# Patient Record
Sex: Female | Born: 1970 | Race: White | Hispanic: No | Marital: Married | Smoking: Never smoker
Health system: Southern US, Community
[De-identification: ages and names within clinical notes are randomized; demographics above are authoritative.]

## PROBLEM LIST (undated history)

## (undated) DIAGNOSIS — N840 Polyp of corpus uteri: Secondary | ICD-10-CM

## (undated) DIAGNOSIS — E063 Autoimmune thyroiditis: Secondary | ICD-10-CM

## (undated) DIAGNOSIS — R32 Unspecified urinary incontinence: Secondary | ICD-10-CM

## (undated) DIAGNOSIS — N946 Dysmenorrhea, unspecified: Secondary | ICD-10-CM

## (undated) DIAGNOSIS — K219 Gastro-esophageal reflux disease without esophagitis: Secondary | ICD-10-CM

## (undated) DIAGNOSIS — D649 Anemia, unspecified: Secondary | ICD-10-CM

## (undated) DIAGNOSIS — R87619 Unspecified abnormal cytological findings in specimens from cervix uteri: Secondary | ICD-10-CM

## (undated) DIAGNOSIS — K760 Fatty (change of) liver, not elsewhere classified: Secondary | ICD-10-CM

## (undated) DIAGNOSIS — I10 Essential (primary) hypertension: Secondary | ICD-10-CM

## (undated) DIAGNOSIS — F419 Anxiety disorder, unspecified: Secondary | ICD-10-CM

## (undated) HISTORY — PX: CHOLECYSTECTOMY: SHX55

## (undated) HISTORY — DX: Anemia, unspecified: D64.9

## (undated) HISTORY — DX: Essential (primary) hypertension: I10

## (undated) HISTORY — DX: Unspecified urinary incontinence: R32

## (undated) HISTORY — DX: Dysmenorrhea, unspecified: N94.6

## (undated) HISTORY — DX: Anxiety disorder, unspecified: F41.9

## (undated) HISTORY — DX: Unspecified abnormal cytological findings in specimens from cervix uteri: R87.619

## (undated) HISTORY — DX: Gastro-esophageal reflux disease without esophagitis: K21.9

## (undated) HISTORY — DX: Fatty (change of) liver, not elsewhere classified: K76.0

## (undated) HISTORY — DX: Autoimmune thyroiditis: E06.3

## (undated) HISTORY — DX: Polyp of corpus uteri: N84.0

---

## 2000-05-27 ENCOUNTER — Other Ambulatory Visit: Admission: RE | Admit: 2000-05-27 | Discharge: 2000-05-27 | Payer: Self-pay | Admitting: Obstetrics and Gynecology

## 2003-04-15 ENCOUNTER — Encounter: Payer: Self-pay | Admitting: Family Medicine

## 2003-04-15 ENCOUNTER — Ambulatory Visit (HOSPITAL_COMMUNITY): Admission: RE | Admit: 2003-04-15 | Discharge: 2003-04-15 | Payer: Self-pay | Admitting: Family Medicine

## 2003-05-13 ENCOUNTER — Encounter: Payer: Self-pay | Admitting: Family Medicine

## 2003-05-13 ENCOUNTER — Ambulatory Visit (HOSPITAL_COMMUNITY): Admission: RE | Admit: 2003-05-13 | Discharge: 2003-05-13 | Payer: Self-pay | Admitting: Family Medicine

## 2003-08-24 ENCOUNTER — Observation Stay (HOSPITAL_COMMUNITY): Admission: RE | Admit: 2003-08-24 | Discharge: 2003-08-25 | Payer: Self-pay | Admitting: General Surgery

## 2003-12-27 ENCOUNTER — Other Ambulatory Visit: Admission: RE | Admit: 2003-12-27 | Discharge: 2003-12-27 | Payer: Self-pay | Admitting: Obstetrics & Gynecology

## 2003-12-27 ENCOUNTER — Encounter: Admission: RE | Admit: 2003-12-27 | Discharge: 2003-12-27 | Payer: Self-pay | Admitting: Obstetrics and Gynecology

## 2004-04-24 ENCOUNTER — Ambulatory Visit: Payer: Self-pay | Admitting: Obstetrics & Gynecology

## 2004-04-26 ENCOUNTER — Ambulatory Visit: Payer: Self-pay | Admitting: *Deleted

## 2004-05-10 ENCOUNTER — Ambulatory Visit: Payer: Self-pay | Admitting: Family Medicine

## 2004-05-14 ENCOUNTER — Ambulatory Visit: Payer: Self-pay | Admitting: Family Medicine

## 2004-05-21 ENCOUNTER — Ambulatory Visit: Payer: Self-pay | Admitting: Family Medicine

## 2004-08-06 ENCOUNTER — Ambulatory Visit: Payer: Self-pay | Admitting: Family Medicine

## 2004-08-17 ENCOUNTER — Ambulatory Visit: Payer: Self-pay | Admitting: Family Medicine

## 2004-09-20 ENCOUNTER — Ambulatory Visit: Payer: Self-pay | Admitting: Family Medicine

## 2004-10-29 ENCOUNTER — Ambulatory Visit: Payer: Self-pay | Admitting: Family Medicine

## 2004-11-09 ENCOUNTER — Ambulatory Visit: Payer: Self-pay | Admitting: Pulmonary Disease

## 2004-11-21 ENCOUNTER — Encounter: Admission: RE | Admit: 2004-11-21 | Discharge: 2005-02-19 | Payer: Self-pay | Admitting: Pulmonary Disease

## 2004-11-23 ENCOUNTER — Other Ambulatory Visit: Admission: RE | Admit: 2004-11-23 | Discharge: 2004-11-23 | Payer: Self-pay | Admitting: Obstetrics and Gynecology

## 2004-11-30 ENCOUNTER — Encounter: Admission: RE | Admit: 2004-11-30 | Discharge: 2004-11-30 | Payer: Self-pay | Admitting: Obstetrics and Gynecology

## 2005-04-25 ENCOUNTER — Ambulatory Visit: Payer: Self-pay | Admitting: Pulmonary Disease

## 2005-07-03 ENCOUNTER — Other Ambulatory Visit: Admission: RE | Admit: 2005-07-03 | Discharge: 2005-07-03 | Payer: Self-pay | Admitting: Obstetrics and Gynecology

## 2005-08-19 DIAGNOSIS — R87619 Unspecified abnormal cytological findings in specimens from cervix uteri: Secondary | ICD-10-CM

## 2005-08-19 HISTORY — DX: Unspecified abnormal cytological findings in specimens from cervix uteri: R87.619

## 2005-08-23 ENCOUNTER — Other Ambulatory Visit: Admission: RE | Admit: 2005-08-23 | Discharge: 2005-08-23 | Payer: Self-pay | Admitting: Obstetrics and Gynecology

## 2005-11-18 ENCOUNTER — Ambulatory Visit: Payer: Self-pay | Admitting: Pulmonary Disease

## 2005-12-24 ENCOUNTER — Other Ambulatory Visit: Admission: RE | Admit: 2005-12-24 | Discharge: 2005-12-24 | Payer: Self-pay | Admitting: Obstetrics & Gynecology

## 2005-12-31 ENCOUNTER — Other Ambulatory Visit: Admission: RE | Admit: 2005-12-31 | Discharge: 2005-12-31 | Payer: Self-pay | Admitting: Obstetrics & Gynecology

## 2006-02-13 ENCOUNTER — Ambulatory Visit: Payer: Self-pay | Admitting: Pulmonary Disease

## 2006-05-02 ENCOUNTER — Other Ambulatory Visit: Admission: RE | Admit: 2006-05-02 | Discharge: 2006-05-02 | Payer: Self-pay | Admitting: Obstetrics & Gynecology

## 2006-06-13 ENCOUNTER — Ambulatory Visit: Payer: Self-pay | Admitting: Internal Medicine

## 2006-09-01 ENCOUNTER — Other Ambulatory Visit: Admission: RE | Admit: 2006-09-01 | Discharge: 2006-09-01 | Payer: Self-pay | Admitting: Obstetrics & Gynecology

## 2006-11-19 ENCOUNTER — Ambulatory Visit: Payer: Self-pay | Admitting: Pulmonary Disease

## 2006-11-19 LAB — CONVERTED CEMR LAB
ALT: 18 units/L (ref 0–40)
AST: 22 units/L (ref 0–37)
Albumin: 3.3 g/dL — ABNORMAL LOW (ref 3.5–5.2)
Alkaline Phosphatase: 56 units/L (ref 39–117)
BUN: 15 mg/dL (ref 6–23)
Basophils Absolute: 0 10*3/uL (ref 0.0–0.1)
Basophils Relative: 0.1 % (ref 0.0–1.0)
Bilirubin Urine: NEGATIVE
Bilirubin, Direct: 0.1 mg/dL (ref 0.0–0.3)
CO2: 25 meq/L (ref 19–32)
Calcium: 9.2 mg/dL (ref 8.4–10.5)
Chloride: 109 meq/L (ref 96–112)
Cholesterol: 207 mg/dL (ref 0–200)
Creatinine, Ser: 0.8 mg/dL (ref 0.4–1.2)
Direct LDL: 120.8 mg/dL
Eosinophils Absolute: 0.1 10*3/uL (ref 0.0–0.6)
Eosinophils Relative: 0.5 % (ref 0.0–5.0)
GFR calc Af Amer: 105 mL/min
GFR calc non Af Amer: 87 mL/min
Glucose, Bld: 95 mg/dL (ref 70–99)
HCT: 40.6 % (ref 36.0–46.0)
HDL: 53.1 mg/dL (ref >39.0)
Hemoglobin, Urine: NEGATIVE
Hemoglobin: 14.2 g/dL (ref 12.0–15.0)
Ketones, ur: NEGATIVE mg/dL
Leukocytes, UA: NEGATIVE
Lymphocytes Relative: 31.8 % (ref 12.0–46.0)
MCHC: 34.9 g/dL (ref 30.0–36.0)
MCV: 88.2 fL (ref 78.0–100.0)
Monocytes Absolute: 0.5 10*3/uL (ref 0.2–0.7)
Monocytes Relative: 4.1 % (ref 3.0–11.0)
Neutro Abs: 6.9 10*3/uL (ref 1.4–7.7)
Neutrophils Relative %: 63.5 % (ref 43.0–77.0)
Nitrite: NEGATIVE
Platelets: 268 10*3/uL (ref 150–400)
Potassium: 4.1 meq/L (ref 3.5–5.1)
RBC: 4.6 M/uL (ref 3.87–5.11)
RDW: 11.7 % (ref 11.5–14.6)
Sodium: 142 meq/L (ref 135–145)
Specific Gravity, Urine: 1.025 (ref 1.000–1.03)
TSH: 1.43 microintl units/mL (ref 0.35–5.50)
Total Bilirubin: 0.6 mg/dL (ref 0.3–1.2)
Total CHOL/HDL Ratio: 3.9
Total Protein, Urine: NEGATIVE mg/dL
Total Protein: 6.8 g/dL (ref 6.0–8.3)
Triglycerides: 383 mg/dL (ref 0–149)
Urine Glucose: NEGATIVE mg/dL
Urobilinogen, UA: 0.2 (ref 0.0–1.0)
VLDL: 77 mg/dL — ABNORMAL HIGH (ref 0–40)
WBC: 11 10*3/uL — ABNORMAL HIGH (ref 4.5–10.5)
pH: 6 (ref 5.0–8.0)

## 2008-05-20 IMAGING — CR DG CHEST 2V
2 series · 2 of 2 positions shown · non-contrast
Comparison: none

HISTORY: Physical exam, hypertension,

CHEST 2 VIEWS:
Comparison 11/18/2005
Mild cardiac enlargement.
Normal mediastinal contours and pulmonary vascularity.
Lungs clear.
No pleural effusion or pneumothorax.

[view not recorded (1 of 2)]
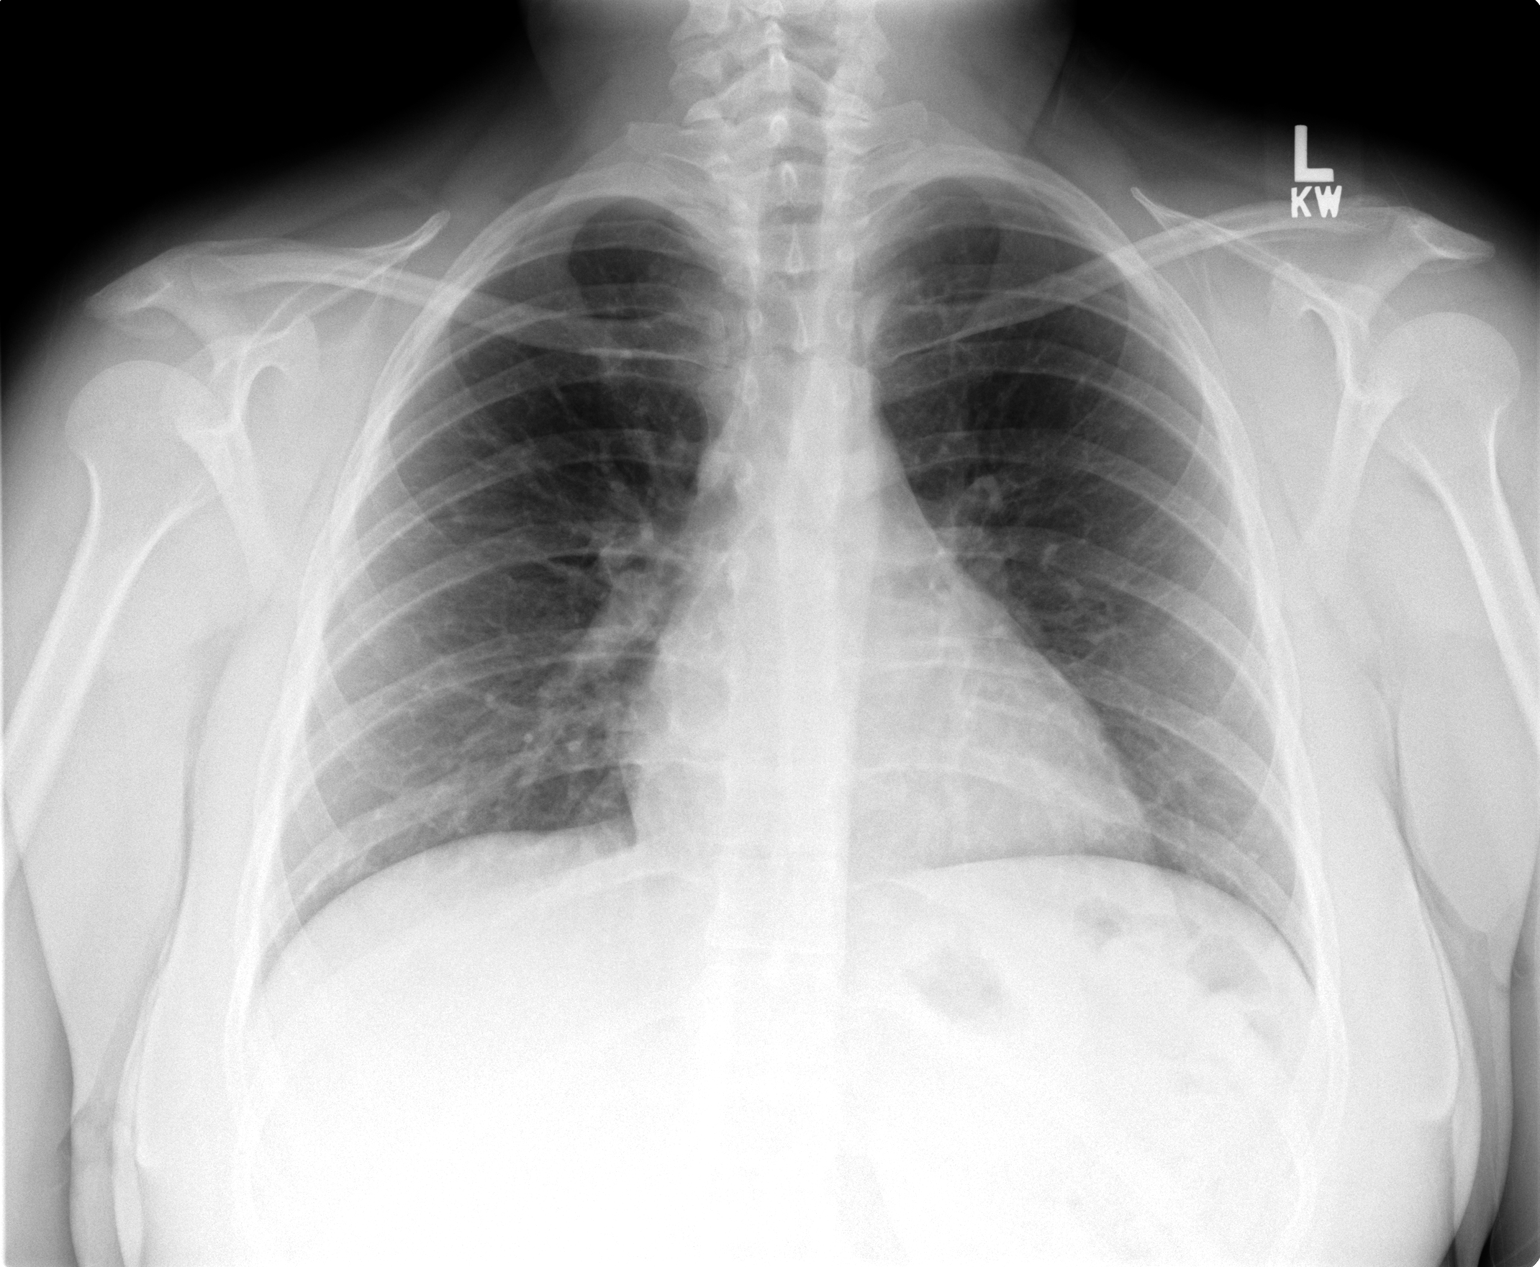

[view not recorded (2 of 2)]
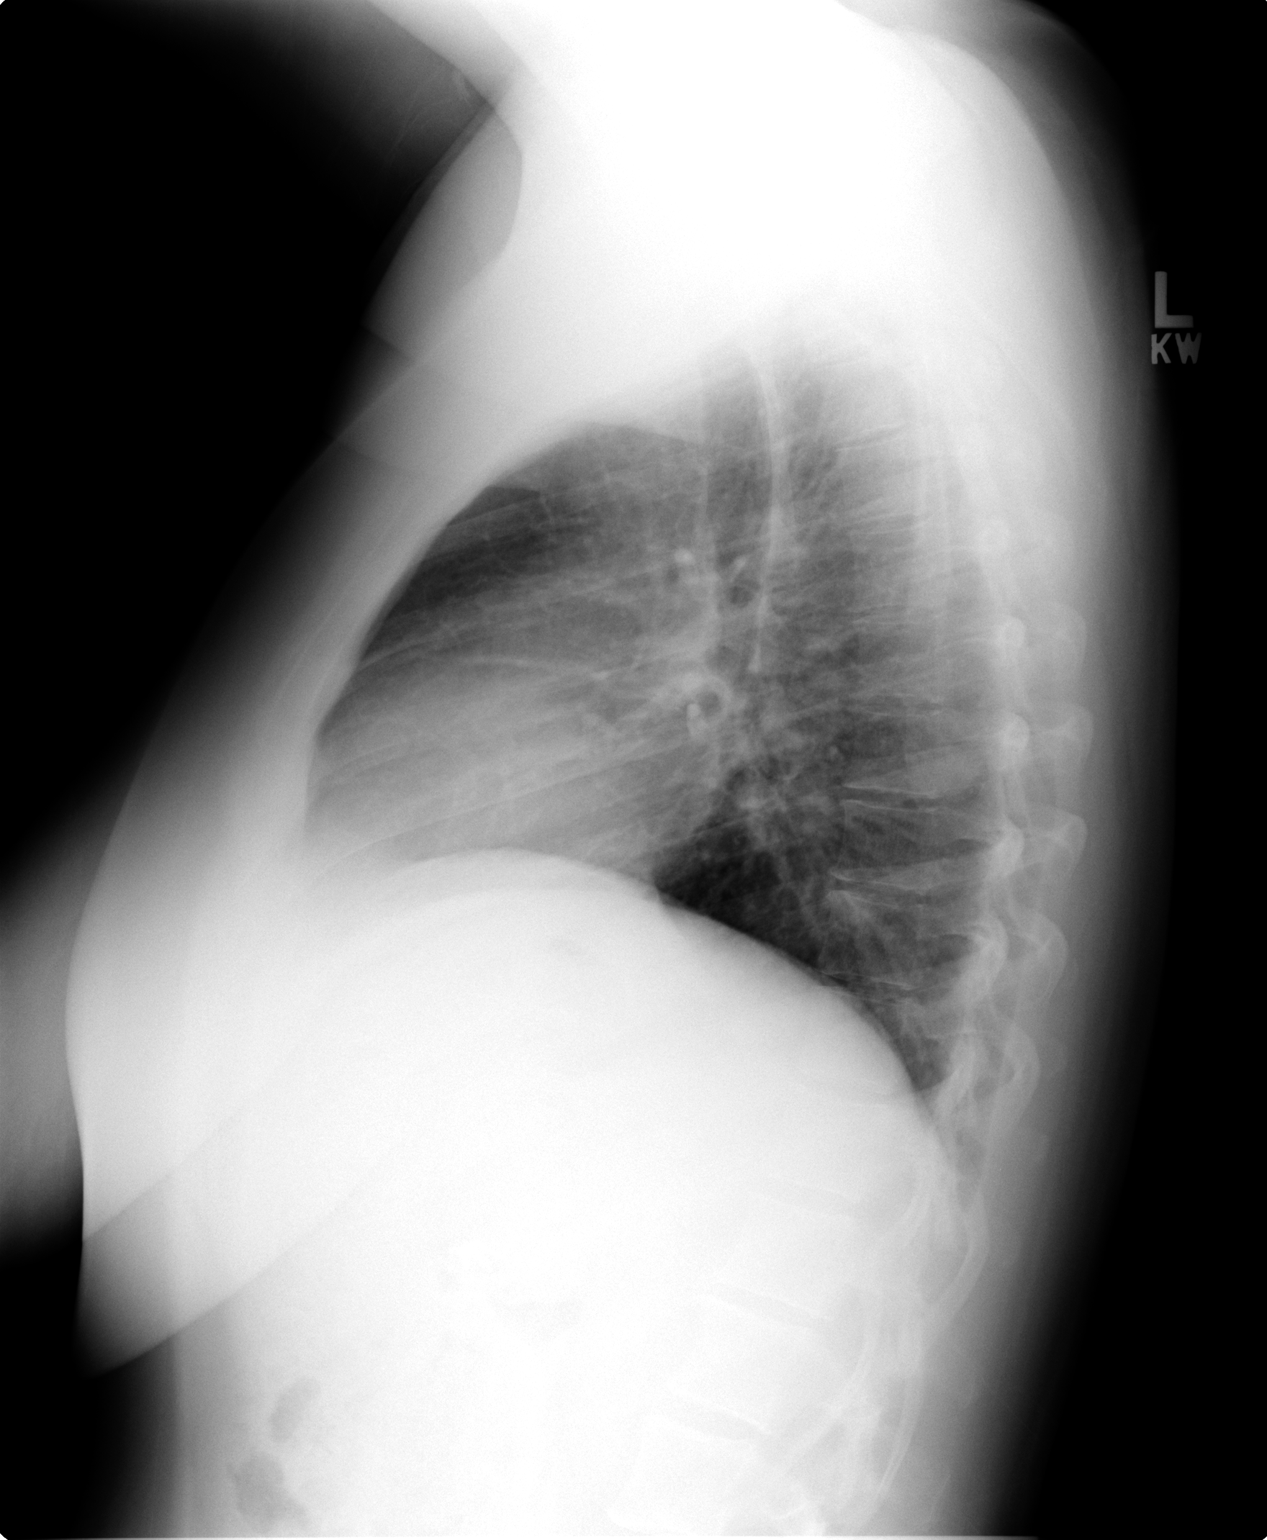

[2 of 2 positions shown; findings below may reference images not displayed]

IMPRESSION: Stable cardiac enlargement.

## 2008-06-27 ENCOUNTER — Telehealth (INDEPENDENT_AMBULATORY_CARE_PROVIDER_SITE_OTHER): Payer: Self-pay | Admitting: *Deleted

## 2012-06-01 DIAGNOSIS — J309 Allergic rhinitis, unspecified: Secondary | ICD-10-CM | POA: Insufficient documentation

## 2012-06-01 DIAGNOSIS — D72829 Elevated white blood cell count, unspecified: Secondary | ICD-10-CM | POA: Insufficient documentation

## 2012-06-01 DIAGNOSIS — E042 Nontoxic multinodular goiter: Secondary | ICD-10-CM | POA: Insufficient documentation

## 2012-06-01 DIAGNOSIS — K219 Gastro-esophageal reflux disease without esophagitis: Secondary | ICD-10-CM | POA: Insufficient documentation

## 2012-06-01 DIAGNOSIS — E039 Hypothyroidism, unspecified: Secondary | ICD-10-CM | POA: Insufficient documentation

## 2012-06-01 DIAGNOSIS — I10 Essential (primary) hypertension: Secondary | ICD-10-CM | POA: Insufficient documentation

## 2012-06-01 DIAGNOSIS — E559 Vitamin D deficiency, unspecified: Secondary | ICD-10-CM | POA: Insufficient documentation

## 2012-06-01 DIAGNOSIS — Z6841 Body Mass Index (BMI) 40.0 and over, adult: Secondary | ICD-10-CM | POA: Insufficient documentation

## 2014-07-19 HISTORY — PX: HYSTEROSCOPY: SHX211

## 2015-03-24 DIAGNOSIS — N939 Abnormal uterine and vaginal bleeding, unspecified: Secondary | ICD-10-CM | POA: Insufficient documentation

## 2015-10-12 ENCOUNTER — Telehealth: Payer: Self-pay | Admitting: Obstetrics & Gynecology

## 2015-10-12 NOTE — Telephone Encounter (Signed)
Called and left a message for patient to call back to schedule a new patient appointment with Dr. Hyacinth Meeker for a second opinion.

## 2015-11-16 ENCOUNTER — Encounter: Payer: Self-pay | Admitting: Obstetrics & Gynecology

## 2015-11-16 ENCOUNTER — Ambulatory Visit (INDEPENDENT_AMBULATORY_CARE_PROVIDER_SITE_OTHER): Payer: BC Managed Care – PPO

## 2015-11-16 ENCOUNTER — Ambulatory Visit (INDEPENDENT_AMBULATORY_CARE_PROVIDER_SITE_OTHER): Payer: BC Managed Care – PPO | Admitting: Obstetrics & Gynecology

## 2015-11-16 VITALS — BP 136/86 | HR 76 | Resp 16 | Ht 63.5 in | Wt 268.0 lb

## 2015-11-16 DIAGNOSIS — Z124 Encounter for screening for malignant neoplasm of cervix: Secondary | ICD-10-CM | POA: Diagnosis not present

## 2015-11-16 DIAGNOSIS — Z8742 Personal history of other diseases of the female genital tract: Secondary | ICD-10-CM | POA: Diagnosis not present

## 2015-11-16 DIAGNOSIS — I10 Essential (primary) hypertension: Secondary | ICD-10-CM

## 2015-11-16 DIAGNOSIS — Z01419 Encounter for gynecological examination (general) (routine) without abnormal findings: Secondary | ICD-10-CM

## 2015-11-16 DIAGNOSIS — E669 Obesity, unspecified: Secondary | ICD-10-CM | POA: Diagnosis not present

## 2015-11-16 NOTE — Progress Notes (Addendum)
45 y.o. G0P0000 MarriedCaucasianF here for annual exam and to discuss bleeding issues.  Pt has irregular menstrual cycles.  Pt had hysteroscopy and D&C and cervical polyp resection 12/15.  Pathology was benign showing "multiple portions of benign endometrial polyp and fragments of polypoid endocervical glandular tissue, negative for hyperplasia, dysplasia or malignancy."  FSH and estradiol in 10/15 were 23 and 32, respectively.  Bleeding continued after the hysteroscopy and repeat PUS was done 2/16 and again 6/16.  2.5cm polyp was noted and repeat hysteroscopy was recommended.  Pt did not feel comfortable with this and had a second opinion who recommended the same thing--repeat hysteroscopy with polyp resection.  To date, she has declined to proceed with this.  Reports the last 5-6 months, cycles have been regular and flow lasting 5-7 days.  One to two are heavy but totally manageable.    Pt was on oral contraception until 2014.  She stopped it then and has not been on anything since.  Would prefer not to be on oral combination OCPs if possible.  Patient's last menstrual period was 11/05/2015.          Sexually active: Yes.    The current method of family planning is none.    Exercising: Yes.    Walking Smoker:  no  Health Maintenance: Pap:  01/14/2013 Negative  History of abnormal Pap:  yes MMG:  10/21/2014 BIRADS1:neg [facility] in [place]) Colonoscopy:  2015 polyp  BMD:   12/04/2004  TDaP:  2014 (pt reports.  She is pretty sure about this.) Screening Labs: Done   reports that she has never smoked. She has never used smokeless tobacco. She reports that she does not drink alcohol or use illicit drugs.  Past Medical History  Diagnosis Date  . Anemia   . Anxiety   . Dysmenorrhea   . Hypertension   . Urinary incontinence   . Acid reflux   . Hashimoto's disease     No past surgical history on file.  Current Outpatient Prescriptions  Medication Sig Dispense Refill  .  ALPRAZolam (XANAX) 0.5 MG tablet Take 0.5 mg by mouth 3 (three) times daily as needed for anxiety.    Marland Kitchen amitriptyline (ELAVIL) 25 MG tablet Take 1-2 tablets by mouth daily.    . benazepril (LOTENSIN) 20 MG tablet Take 1 tablet by mouth daily.    . cetirizine (ZYRTEC) 10 MG tablet Take 1 tablet by mouth daily.    . Cholecalciferol (PA VITAMIN D-3) 2000 units CAPS daily.    Marland Kitchen dicyclomine (BENTYL) 10 MG capsule Take 10-20 mg by mouth 4 (four) times daily as needed for spasms.    . fluticasone (FLONASE) 50 MCG/ACT nasal spray daily.    . furosemide (LASIX) 20 MG tablet Take 1-2 tablets by mouth daily.    . Glucos-Chondroit-Hyaluron-MSM (GLUCOSAMINE CHONDROITIN JOINT PO) Take by mouth daily.    . Levothyroxine Sodium (TIROSINT) 112 MCG CAPS Take 1 capsule by mouth daily.    . Magnesium 400 MG TABS Take by mouth daily.    . metFORMIN (GLUCOPHAGE) 500 MG tablet Take 500 mg by mouth 2 (two) times daily with a meal.    . metoprolol succinate (TOPROL-XL) 50 MG 24 hr tablet Take 1 tablet by mouth daily.    . Milk Thistle 250 MG CAPS Take by mouth daily.    Marland Kitchen nystatin-triamcinolone (MYCOLOG II) cream as needed.    . Omega-3 Fatty Acids (FISH OIL) 1200 MG CAPS Take by mouth daily.    Marland Kitchen  omeprazole (PRILOSEC) 10 MG capsule Take 20 mg by mouth daily.    . potassium chloride (K-DUR) 10 MEQ tablet Take 1-2 tablets by mouth daily.    Marland Kitchen. RASPBERRY PO Take by mouth. Red Raspberry Leaf  2 caps daily    . vitamin C (ASCORBIC ACID) 500 MG tablet Take 500 mg by mouth daily.     No current facility-administered medications for this visit.    Family History  Problem Relation Age of Onset  . Heart disease Mother   . Hypertension Mother   . Heart attack Father   . Hypertension Father   . Hyperlipidemia Father   . Cancer Brother     Prostate  . Hypertension Brother     ROS:  Pertinent items are noted in HPI.  Otherwise, a comprehensive ROS was negative.  Exam:   BP 136/86 mmHg  Pulse 76  Resp 16  Ht 5'  3.5" (1.613 m)  Wt 268 lb (121.564 kg)  BMI 46.72 kg/m2  LMP 11/05/2015   Height: 5' 3.5" (161.3 cm)  Ht Readings from Last 3 Encounters:  11/16/15 5' 3.5" (1.613 m)    General appearance: alert, cooperative and appears stated age Head: Normocephalic, without obvious abnormality, atraumatic Neck: no adenopathy, supple, symmetrical, trachea midline and thyroid normal to inspection and palpation Lungs: clear to auscultation bilaterally Breasts: normal appearance, no masses or tenderness Heart: regular rate and rhythm Abdomen: soft, non-tender; bowel sounds normal; no masses,  no organomegaly Extremities: extremities normal, atraumatic, no cyanosis or edema Skin: Skin color, texture, turgor normal. No rashes or lesions Lymph nodes: Cervical, supraclavicular, and axillary nodes normal. No abnormal inguinal nodes palpated Neurologic: Grossly normal   Pelvic: External genitalia:  no lesions              Urethra:  normal appearing urethra with no masses, tenderness or lesions              Bartholins and Skenes: normal                 Vagina: normal appearing vagina with normal color and discharge, no lesions              Cervix: no lesions              Pap taken: Yes.   Bimanual Exam:  Uterus:  normal size, contour, position, consistency, mobility, non-tender              Adnexa: normal adnexa and no mass, fullness, tenderness               Rectovaginal: Confirms               Anus:  normal sphincter tone, no lesions  Chaperone was present for exam.  D/w pt proceeding with PUS to see if endometrium is consistent with 2.5cm finding on prior ultrasound.  [place] in [place], so would like to do today or tomorrow, if possible.  Pt returned later in the day for PUS.    Uterus:  7.7 x 4.5 x 4.4cm Endometrium:  8.393mm Left ovary:  2.2 x 1.1 x 1.8cm Right ovary:  2.6 x 1.5 x 1.5cm Cul de sac:  No free fluid  Images and findings reviewed with pt.    A:  Well Woman with normal  exam Obesity H/O cervical polyp resection with 6-9 months of heavy and irregular bleeding in the past, normalized over last 5-6 months.  8.423mm endometrium on PUS today Hypertension Diabetes Elevated  [facility] 10/15 of 23 H/O hashimoto's thyroiditis with subsequent hypothyroidism (Dr. Drexel Iha, endocrinologist at [facility]) H/o thyroid nodules  P:   Mammogram screening due.  Will try and schedule for pt while she is in town today and tomorrow. Pap smear with HR HPV today FSH obtained today.  return annually or prn  In additional to exam, about 20 minutes spent in discussion about recommendations and ultrasound results.

## 2015-11-16 NOTE — Progress Notes (Signed)
Results documented with note from same day of visit.

## 2015-11-17 ENCOUNTER — Other Ambulatory Visit: Payer: Self-pay | Admitting: Obstetrics & Gynecology

## 2015-11-17 ENCOUNTER — Telehealth: Payer: Self-pay | Admitting: Obstetrics & Gynecology

## 2015-11-17 DIAGNOSIS — Z1239 Encounter for other screening for malignant neoplasm of breast: Secondary | ICD-10-CM

## 2015-11-17 DIAGNOSIS — Z1231 Encounter for screening mammogram for malignant neoplasm of breast: Secondary | ICD-10-CM

## 2015-11-17 LAB — FOLLICLE STIMULATING HORMONE: FSH: 7.6 m[IU]/mL

## 2015-11-17 NOTE — Telephone Encounter (Signed)
Patient is calling regarding getting a 3-D MMG done today at [facility]. Patient was seen yesterday and was told we could help her get this scheduled for today if possible. Patient said she could schedule for another day if needed.

## 2015-11-17 NOTE — Telephone Encounter (Signed)
Order placed for screening 3D mammogram for patient.  She is given phone number to call and schedule mammogram at her convenience. Advised same day appointment may be possible if they have cancellations but would need to complete records transfer.  Patient scheduled for annual exam with Dr. Hyacinth MeekerMiller for 4/2/ 2018.  Routing to provider for final review. Patient agreeable to disposition. Will close encounter.

## 2015-11-18 ENCOUNTER — Encounter: Payer: Self-pay | Admitting: Obstetrics & Gynecology

## 2015-11-21 ENCOUNTER — Encounter: Payer: Self-pay | Admitting: Obstetrics & Gynecology

## 2015-11-21 LAB — IPS PAP TEST WITH HPV

## 2015-11-22 ENCOUNTER — Telehealth: Payer: Self-pay | Admitting: Obstetrics and Gynecology

## 2015-11-23 NOTE — Addendum Note (Signed)
Addended by: Jerene BearsMILLER, Jakaden Ouzts S on: 11/23/2015 06:43 AM   Modules accepted: Kipp BroodSmartSet

## 2015-12-05 NOTE — Telephone Encounter (Signed)
error 

## 2015-12-08 ENCOUNTER — Ambulatory Visit
Admission: RE | Admit: 2015-12-08 | Discharge: 2015-12-08 | Disposition: A | Discharge status: Home / Self Care | Payer: Self-pay | Source: Ambulatory Visit | Attending: Obstetrics & Gynecology | Admitting: Obstetrics & Gynecology

## 2015-12-08 ENCOUNTER — Encounter: Payer: Self-pay | Admitting: Oncology

## 2015-12-08 DIAGNOSIS — Z1231 Encounter for screening mammogram for malignant neoplasm of breast: Secondary | ICD-10-CM

## 2015-12-11 ENCOUNTER — Encounter: Payer: Self-pay | Admitting: Oncology

## 2015-12-12 ENCOUNTER — Encounter: Payer: Self-pay | Admitting: Oncology

## 2015-12-21 ENCOUNTER — Encounter: Payer: Self-pay | Admitting: Obstetrics & Gynecology

## 2015-12-24 NOTE — Telephone Encounter (Signed)
Sierra Benjamin, please complete and then notify patient.

## 2016-01-11 ENCOUNTER — Telehealth: Payer: Self-pay | Admitting: Obstetrics & Gynecology

## 2016-01-11 NOTE — Telephone Encounter (Signed)
Records faxed to Dr Pat KocherPepper's office per patient request.

## 2016-01-11 NOTE — Telephone Encounter (Signed)
Patient calling to check on her request to have her last office notes sent to her primary Dr. Rhea BleacherJeremy Peppers @ [facility] fax# [phone].

## 2016-02-17 HISTORY — PX: BIOPSY THYROID: PRO38

## 2016-03-20 ENCOUNTER — Encounter: Payer: Self-pay | Admitting: Obstetrics & Gynecology

## 2016-05-22 ENCOUNTER — Encounter: Payer: Self-pay | Admitting: Obstetrics & Gynecology

## 2016-05-22 ENCOUNTER — Other Ambulatory Visit: Payer: Self-pay | Admitting: Obstetrics & Gynecology

## 2016-05-22 MED ORDER — FLUCONAZOLE 150 MG PO TABS: 150.0000 mg | ORAL_TABLET | Freq: Once | ORAL | 0 refills | Status: AC

## 2016-05-22 NOTE — Progress Notes (Signed)
Rx for Diflucan 150mg  po q 1, repeat 72 hrs if needed.  #2/0RF sent to pharmacy per request via Mychart.

## 2016-11-18 ENCOUNTER — Encounter: Payer: Self-pay | Admitting: Obstetrics & Gynecology

## 2016-11-18 ENCOUNTER — Ambulatory Visit (INDEPENDENT_AMBULATORY_CARE_PROVIDER_SITE_OTHER): Payer: BC Managed Care – PPO | Admitting: Obstetrics & Gynecology

## 2016-11-18 VITALS — BP 150/100 | HR 80 | Resp 18 | Ht 63.75 in | Wt 278.0 lb

## 2016-11-18 DIAGNOSIS — N912 Amenorrhea, unspecified: Secondary | ICD-10-CM | POA: Diagnosis not present

## 2016-11-18 DIAGNOSIS — Z0000 Encounter for general adult medical examination without abnormal findings: Secondary | ICD-10-CM

## 2016-11-18 DIAGNOSIS — Z01419 Encounter for gynecological examination (general) (routine) without abnormal findings: Secondary | ICD-10-CM

## 2016-11-18 LAB — POCT URINALYSIS DIPSTICK
BILIRUBIN UA: NEGATIVE
Blood, UA: NEGATIVE
Glucose, UA: NEGATIVE
KETONES UA: NEGATIVE
Leukocytes, UA: NEGATIVE
Nitrite, UA: NEGATIVE
PH UA: 5 (ref 5.0–8.0)
PROTEIN UA: NEGATIVE
Urobilinogen, UA: NEGATIVE (ref ?–2.0)

## 2016-11-18 MED ORDER — ESTROGENS, CONJUGATED 0.625 MG/GM VA CREA: TOPICAL_CREAM | VAGINAL | 1 refills | Status: DC

## 2016-11-18 NOTE — Progress Notes (Signed)
46 y.o. G0P0000 MarriedCaucasianF here for annual exam.  Doing well.  Had colonoscopy on Friday due to rectal bleeding.  Has hemorrhoids only.  Recommended repeating at 50.    Cycles were regular until July and then skipped until March.  Flow was normal and lasted 7 days.  Had no clots.  Wasn't heavy.  Felt "normal".    Patient's last menstrual period was 10/19/2016.          Sexually active: Yes.   Rarely.  Last episode of intercourse was about two months ago. The current method of family planning is none.    Exercising: Yes.    walking Smoker:  no  Health Maintenance: Pap:  11/16/15 Neg. HR HPV:Neg. 01/14/13 Neg.  History of abnormal Pap:  yes MMG:  12/08/15 BIRADS1:neg   Colonoscopy:  11/15/16 hemorrhoids. f/u 5 years  BMD:   12/04/04 normal  TDaP:  2014 Hep C testing: not indicated Screening Labs: PCP, Urine today: normal    reports that she has never smoked. She has never used smokeless tobacco. She reports that she does not drink alcohol or use drugs.  Past Medical History:  Diagnosis Date  . Abnormal Pap smear of cervix 2007  . Acid reflux   . Anemia   . Anxiety   . Dysmenorrhea   . Hashimoto's disease   . Hypertension   . Urinary incontinence   . Uterine polyp     Past Surgical History:  Procedure Laterality Date  . BIOPSY THYROID  02/2016   inconclusive   . CHOLECYSTECTOMY    . HYSTEROSCOPY  07/2014   w/ Polypectomy - Benign     Current Outpatient Prescriptions  Medication Sig Dispense Refill  . ALPRAZolam (XANAX) 0.5 MG tablet Take 0.5 mg by mouth 3 (three) times daily as needed for anxiety.    Marland Kitchen amitriptyline (ELAVIL) 25 MG tablet Take 1-2 tablets by mouth daily.    . cetirizine (ZYRTEC) 10 MG tablet Take 1 tablet by mouth daily.    . Cholecalciferol (PA VITAMIN D-3) 2000 units CAPS daily.    Marland Kitchen dicyclomine (BENTYL) 10 MG capsule Take 10-20 mg by mouth 4 (four) times daily as needed for spasms.    . fluticasone (FLONASE) 50 MCG/ACT nasal spray daily.    .  Glucos-Chondroit-Hyaluron-MSM (GLUCOSAMINE CHONDROITIN JOINT PO) Take by mouth daily.    . hydrochlorothiazide (HYDRODIURIL) 25 MG tablet Take 1 tablet by mouth daily.    . Levothyroxine Sodium 137 MCG CAPS Take 1 capsule by mouth daily.    . Magnesium 400 MG TABS Take by mouth daily.    . metFORMIN (GLUCOPHAGE) 500 MG tablet Take 500 mg by mouth 2 (two) times daily with a meal.    . metoprolol succinate (TOPROL-XL) 50 MG 24 hr tablet Take 1 tablet by mouth daily.    . Milk Thistle 250 MG CAPS Take by mouth daily.    Marland Kitchen nystatin-triamcinolone (MYCOLOG II) cream as needed.    . Omega-3 Fatty Acids (FISH OIL) 1200 MG CAPS Take by mouth daily.    Marland Kitchen omeprazole (PRILOSEC) 20 MG capsule Take 1 capsule by mouth daily.    . potassium chloride (K-DUR) 10 MEQ tablet Take 1-2 tablets by mouth daily.    Marland Kitchen RASPBERRY PO Take by mouth. Red Raspberry Leaf  2 caps daily    . valsartan (DIOVAN) 320 MG tablet Take 1 tablet by mouth daily.    . vitamin C (ASCORBIC ACID) 500 MG tablet Take 500 mg by mouth daily.  No current facility-administered medications for this visit.     Family History  Problem Relation Age of Onset  . Heart disease Mother   . Hypertension Mother   . Pneumonia Mother   . Heart attack Father   . Hypertension Father   . Hyperlipidemia Father   . Cancer Brother     Prostate  . Hypertension Brother     ROS:  Pertinent items are noted in HPI.  Otherwise, a comprehensive ROS was negative.  Exam:   BP (!) 150/100 (BP Location: Right Arm, Patient Position: Sitting, Cuff Size: Large)   Pulse 80   Resp 18   Ht 5' 3.75" (1.619 m)   Wt 278 lb (126.1 kg)   LMP 10/19/2016   BMI 48.09 kg/m   Weight change: +10#  Height: 5' 3.75" (161.9 cm)  Ht Readings from Last 3 Encounters:  11/18/16 5' 3.75" (1.619 m)  11/16/15 5' 3.5" (1.613 m)    General appearance: alert, cooperative and appears stated age Head: Normocephalic, without obvious abnormality, atraumatic Neck: no adenopathy,  supple, symmetrical, trachea midline and thyroid normal to inspection and palpation Lungs: clear to auscultation bilaterally Breasts: normal appearance, no masses or tenderness Heart: regular rate and rhythm Abdomen: soft, non-tender; bowel sounds normal; no masses,  no organomegaly Extremities: extremities normal, atraumatic, no cyanosis or edema Skin: Skin color, texture, turgor normal. No rashes or lesions Lymph nodes: Cervical, supraclavicular, and axillary nodes normal. No abnormal inguinal nodes palpated Neurologic: Grossly normal   Pelvic: External genitalia:  no lesions              Urethra:  normal appearing urethra with no masses, tenderness or lesions              Bartholins and Skenes: normal                 Vagina: normal appearing vagina with normal color and discharge, no lesions              Cervix: no lesions              Pap taken: No. Bimanual Exam:  Uterus:  normal size, contour, position, consistency, mobility, non-tender              Adnexa: normal adnexa and no mass, fullness, tenderness               Rectovaginal: Confirms               Anus:  normal sphincter tone, no lesions  Endometrial biopsy recommended.  Discussed with patient.  Verbal and written consent obtained.   Procedure:  Speculum placed.  Cervix visualized and cleansed with betadine prep.  A single toothed tenaculum was not applied to the anterior lip of the cervix.  Endometrial pipelle was advanced through the cervix into the endometrial cavity without difficulty.  Pipelle passed to 7cm.  Suction applied and pipelle removed with good tissue sample obtained.  Two passes were performed at first pass showed scant specimen.  Tenculum removed.  No bleeding noted.  Patient tolerated procedure well.  Chaperone was present for exam.  A:  Well Woman with normal exam Morbid obesity H/o amenorrhea for almost 9 months in 2017 and into early 2018 Hypertension Diabetes H/O Hashimotos' thyroiditis with  subsequent hypothyroidism, on supplementation and thyroid nodules Hemorrhoids with intermittent bleeding.  Colonoscopy was negative except for hemorrhoids 11/15/16.  P:   Mammogram guidelines reviewed.  Pt will schedule her next one.  Declines having  my office schedule it. pap smear and neg HR HPV 3/17.  No pap today Endometrial biopsy obtained today.  (Has been abstinent for almost two months and had neg UPT on Friday for colonoscopy.) FSH, TSH, Prolactin levels will be obtained today Return annually or prn

## 2016-11-18 NOTE — Addendum Note (Signed)
Addended by: Jerene Bears on: 11/18/2016 03:35 PM   Modules accepted: Orders

## 2016-11-19 LAB — PROLACTIN: PROLACTIN: 9 ng/mL

## 2016-11-19 LAB — TSH: TSH: 0.23 m[IU]/L — AB

## 2016-11-19 LAB — FOLLICLE STIMULATING HORMONE: FSH: 4.6 m[IU]/mL

## 2018-01-11 ENCOUNTER — Encounter: Payer: Self-pay | Admitting: Obstetrics & Gynecology

## 2018-01-13 ENCOUNTER — Telehealth: Payer: Self-pay | Admitting: Obstetrics & Gynecology

## 2018-01-13 NOTE — Telephone Encounter (Signed)
Left message to call Saturnino Liew at [phone].  

## 2018-01-13 NOTE — Telephone Encounter (Signed)
Patient sent the following correspondence through MyChart. Routing to triage to assist patient with request.  ----- Message from Mychart, Generic sent at 01/11/2018 9:15 AM EDT -----    Last period January. Had been every other month. Some cramping like going to start, but no period. Have change in bowel movement- constipation more than normal and discomfort on right side.

## 2018-01-14 NOTE — Telephone Encounter (Signed)
Patient reports menses in 08/2017, light spotting 01/13/18. Feels like cycle is going to start, but has not. Reports moodiness, occasional hot flash, wakes up at 3am daily for a couple of hrs.   Right sided abdominal discomfort when constipated. Takes miralax prn. LBM today, normal. Abdomen soft.  UPT negative on 12/17/17. Was seen at PCP for "stomach bug" at this time.   Denies pelvic pain, fever/chills, N/V, urinary complaints.   Last AEX  11/18/16, FSH 4.6. Next AEX 01/30/18.   Lives in [place], will keep AEX as scheduled. States she discussed provera at last AEX if no menses in 3 months. Advised will review with Dr. Hyacinth Meeker and return call with recommendations. Pharmacy verified.    Dr. Hyacinth Meeker -please review and advise on provera?

## 2018-01-15 NOTE — Telephone Encounter (Signed)
Ok to send in Rx for provera  x 10 days.  Ok to start now.  I will repeat hormone testing when she comes for AEX.  Thanks.

## 2018-01-16 MED ORDER — MEDROXYPROGESTERONE ACETATE 10 MG PO TABS: 10.0000 mg | ORAL_TABLET | Freq: Every day | ORAL | 0 refills | Status: DC

## 2018-01-16 NOTE — Telephone Encounter (Signed)
Spoke with patient, advised as seen below per Dr. Hyacinth MeekerMiller.   Patient has documented allergies to OCP, reports swelling in extremities. Received depo-provera in her 20's, no reaction.   Advised will review with Dr. Hyacinth MeekerMiller before sending RX, patient agreeable.    Dr. Hyacinth MeekerMiller -please review allergies, ok to proceed with provera? Rx pended.

## 2018-01-16 NOTE — Telephone Encounter (Signed)
Spoke with patient, advised as seen below per Dr. Hyacinth Meeker. Patient verbalizes understanding and is agreeable. Rx to verified pharmacy on file. Encounter closed.

## 2018-01-16 NOTE — Telephone Encounter (Signed)
I feel this is ok to start and if she has side effects, she should just stop and let us know.  She can wait until Monday to start if concerned about issues over the weekend.

## 2018-01-30 ENCOUNTER — Ambulatory Visit (INDEPENDENT_AMBULATORY_CARE_PROVIDER_SITE_OTHER): Payer: BC Managed Care – PPO | Admitting: Obstetrics & Gynecology

## 2018-01-30 ENCOUNTER — Encounter: Payer: Self-pay | Admitting: Obstetrics & Gynecology

## 2018-01-30 ENCOUNTER — Encounter

## 2018-01-30 VITALS — BP 140/100 | HR 84 | Resp 16 | Ht 64.0 in | Wt 276.4 lb

## 2018-01-30 DIAGNOSIS — E0500 Thyrotoxicosis with diffuse goiter without thyrotoxic crisis or storm: Secondary | ICD-10-CM

## 2018-01-30 DIAGNOSIS — Z0000 Encounter for general adult medical examination without abnormal findings: Secondary | ICD-10-CM | POA: Diagnosis not present

## 2018-01-30 DIAGNOSIS — E119 Type 2 diabetes mellitus without complications: Secondary | ICD-10-CM

## 2018-01-30 DIAGNOSIS — N912 Amenorrhea, unspecified: Secondary | ICD-10-CM

## 2018-01-30 DIAGNOSIS — R7989 Other specified abnormal findings of blood chemistry: Secondary | ICD-10-CM | POA: Insufficient documentation

## 2018-01-30 DIAGNOSIS — Z01419 Encounter for gynecological examination (general) (routine) without abnormal findings: Secondary | ICD-10-CM

## 2018-01-30 DIAGNOSIS — K429 Umbilical hernia without obstruction or gangrene: Secondary | ICD-10-CM | POA: Insufficient documentation

## 2018-01-30 DIAGNOSIS — R5383 Other fatigue: Secondary | ICD-10-CM

## 2018-01-30 NOTE — Progress Notes (Signed)
47 y.o. G0P0000 MarriedCaucasianF here for annual exam.  Cycles are changing.  For about a year and a half, she's been skipping cycles at least every other month.  Last cycle was in January.  Took provera early June.  Finished about a week ago and has not had any bleeding.   PCP:  Dr. Elsie Raosgrove.  Doesn't typically see MD and often sees mid-level in his office.    LMP:  08/25/17 Sexually active: Yes.    The current method of family planning is none.    Exercising: Yes.    walking Smoker:  no  Health Maintenance: Pap:  11/16/15 Neg. HR HPV:Neg  History of abnormal Pap:  yes MMG:  12/08/15 BIRADS1:Neg  Colonoscopy: 11/15/16 f/u 5 years  BMD:  2006 normal TDaP: 2014 Pneumonia vaccine(s):  n/a Shingrix:   n/a Hep C testing: n/a Screening Labs: Here - fasting    reports that she has never smoked. She has never used smokeless tobacco. She reports that she does not drink alcohol or use drugs.  Past Medical History:  Diagnosis Date  . Abnormal Pap smear of cervix 2007  . Acid reflux   . Anemia   . Anxiety   . Dysmenorrhea   . Hashimoto's disease   . Hypertension   . Urinary incontinence   . Uterine polyp     Past Surgical History:  Procedure Laterality Date  . BIOPSY THYROID  02/2016   inconclusive   . CHOLECYSTECTOMY    . HYSTEROSCOPY  07/2014   w/ Polypectomy - Benign     Current Outpatient Medications  Medication Sig Dispense Refill  . amitriptyline (ELAVIL) 25 MG tablet Take 1-2 tablets by mouth daily.    . cetirizine (ZYRTEC) 10 MG tablet Take 1 tablet by mouth daily.    . Cholecalciferol (PA VITAMIN D-3) 2000 units CAPS daily.    Marland Kitchen. dicyclomine (BENTYL) 10 MG capsule Take 10-20 mg by mouth 4 (four) times daily as needed for spasms.    . fluticasone (FLONASE) 50 MCG/ACT nasal spray daily.    . Levothyroxine Sodium (TIROSINT) 137 MCG CAPS Take 1 capsule by mouth daily.    . metoprolol succinate (TOPROL-XL) 50 MG 24 hr tablet Take 1 tablet by mouth daily.    Marland Kitchen.  nystatin-triamcinolone (MYCOLOG II) cream as needed.    . Omega-3 Fatty Acids (FISH OIL) 1200 MG CAPS Take by mouth daily.    . pantoprazole (PROTONIX) 40 MG tablet Take 1 tablet by mouth 2 (two) times daily.  3  . RASPBERRY PO Take by mouth. Red Raspberry Leaf  2 caps daily    . valsartan (DIOVAN) 160 MG tablet Take 1 tablet by mouth daily.    . vitamin C (ASCORBIC ACID) 500 MG tablet Take 500 mg by mouth daily.    . hydrochlorothiazide (HYDRODIURIL) 25 MG tablet Take 1 tablet by mouth daily.     No current facility-administered medications for this visit.     Family History  Problem Relation Age of Onset  . Heart disease Mother   . Hypertension Mother   . Pneumonia Mother   . Heart attack Father   . Hypertension Father   . Hyperlipidemia Father   . Cancer Brother        Prostate  . Hypertension Brother     Review of Systems  All other systems reviewed and are negative.   Exam:   BP (!) 140/100 (BP Location: Left Wrist, Patient Position: Sitting, Cuff Size: Large)   Pulse 84  Resp 16   Ht 5\' 4"  (1.626 m)   Wt 276 lb 6.4 oz (125.4 kg)   LMP 01/19/2018   BMI 47.44 kg/m   Height: 5\' 4"  (162.6 cm)  Ht Readings from Last 3 Encounters:  01/30/18 5\' 4"  (1.626 m)  11/18/16 5' 3.75" (1.619 m)  11/16/15 5' 3.5" (1.613 m)    General appearance: alert, cooperative and appears stated age Head: Normocephalic, without obvious abnormality, atraumatic Neck: no adenopathy, supple, symmetrical, trachea midline and thyroid normal to inspection and palpation Lungs: clear to auscultation bilaterally Breasts: normal appearance, no masses or tenderness Heart: regular rate and rhythm Abdomen: soft, non-tender; bowel sounds normal; no masses,  no organomegaly Extremities: extremities normal, atraumatic, no cyanosis or edema Skin: Skin color, texture, turgor normal. No rashes or lesions Lymph nodes: Cervical, supraclavicular, and axillary nodes normal. No abnormal inguinal nodes  palpated Neurologic: Grossly normal   Pelvic: External genitalia:  no lesions              Urethra:  normal appearing urethra with no masses, tenderness or lesions              Bartholins and Skenes: normal                 Vagina: normal appearing vagina with normal color and discharge, no lesions              Cervix: no lesions              Pap taken: No. Bimanual Exam:  Uterus:  normal size, contour, position, consistency, mobility, non-tender              Adnexa: normal adnexa and no mass, fullness, tenderness               Rectovaginal: Confirms               Anus:  normal sphincter tone, no lesions  Chaperone was present for exam.  A:  Well Woman with normal exam Perimenopausal Obesity Hypertension, aware of diastolic  Diabetes H/o Hashimoto's thyroiditis and goiter Fatigue   P:   Mammogram guidelines review.  Will try to schedule for her before she leaves today pap smear and HR HPV 2017.  Will repeat next year CBC, CMP, Lipids, TSH, Vit D, B 12, HbA1c obtained today FSH obtained today.  If elevated, plan to repeat provera in 3 months.   Return annually or prn

## 2018-01-31 LAB — COMPREHENSIVE METABOLIC PANEL
A/G RATIO: 1.6 (ref 1.2–2.2)
ALK PHOS: 97 IU/L (ref 39–117)
ALT: 45 IU/L — ABNORMAL HIGH (ref 0–32)
AST: 34 IU/L (ref 0–40)
Albumin: 4.2 g/dL (ref 3.5–5.5)
BUN/Creatinine Ratio: 16 (ref 9–23)
BUN: 15 mg/dL (ref 6–24)
Bilirubin Total: 0.4 mg/dL (ref 0.0–1.2)
CO2: 21 mmol/L (ref 20–29)
Calcium: 9.6 mg/dL (ref 8.7–10.2)
Chloride: 103 mmol/L (ref 96–106)
Creatinine, Ser: 0.93 mg/dL (ref 0.57–1.00)
GFR calc Af Amer: 85 mL/min/{1.73_m2} (ref >59)
GFR, EST NON AFRICAN AMERICAN: 74 mL/min/{1.73_m2} (ref >59)
GLOBULIN, TOTAL: 2.7 g/dL (ref 1.5–4.5)
Glucose: 107 mg/dL — ABNORMAL HIGH (ref 65–99)
POTASSIUM: 4.3 mmol/L (ref 3.5–5.2)
SODIUM: 140 mmol/L (ref 134–144)
Total Protein: 6.9 g/dL (ref 6.0–8.5)

## 2018-01-31 LAB — CBC
Hematocrit: 41.4 % (ref 34.0–46.6)
Hemoglobin: 13.4 g/dL (ref 11.1–15.9)
MCH: 28.9 pg (ref 26.6–33.0)
MCHC: 32.4 g/dL (ref 31.5–35.7)
MCV: 89 fL (ref 79–97)
Platelets: 419 10*3/uL (ref 150–450)
RBC: 4.64 x10E6/uL (ref 3.77–5.28)
RDW: 15.3 % (ref 12.3–15.4)
WBC: 14.9 10*3/uL — ABNORMAL HIGH (ref 3.4–10.8)

## 2018-01-31 LAB — TSH: TSH: 2.64 u[IU]/mL (ref 0.450–4.500)

## 2018-01-31 LAB — VITAMIN D 25 HYDROXY (VIT D DEFICIENCY, FRACTURES): VIT D 25 HYDROXY: 32.3 ng/mL (ref 30.0–100.0)

## 2018-01-31 LAB — LIPID PANEL
CHOL/HDL RATIO: 6 ratio — AB (ref 0.0–4.4)
Cholesterol, Total: 221 mg/dL — ABNORMAL HIGH (ref 100–199)
HDL: 37 mg/dL — ABNORMAL LOW (ref >39)
LDL Calculated: 138 mg/dL — ABNORMAL HIGH (ref 0–99)
Triglycerides: 231 mg/dL — ABNORMAL HIGH (ref 0–149)
VLDL Cholesterol Cal: 46 mg/dL — ABNORMAL HIGH (ref 5–40)

## 2018-01-31 LAB — VITAMIN B12: Vitamin B-12: 314 pg/mL (ref 232–1245)

## 2018-01-31 LAB — T4, FREE: Free T4: 1.41 ng/dL (ref 0.82–1.77)

## 2018-01-31 LAB — FOLLICLE STIMULATING HORMONE: FSH: 22.6 m[IU]/mL

## 2018-01-31 LAB — HEMOGLOBIN A1C
ESTIMATED AVERAGE GLUCOSE: 131 mg/dL
HEMOGLOBIN A1C: 6.2 % — AB (ref 4.8–5.6)

## 2018-01-31 LAB — T3: T3, Total: 113 ng/dL (ref 71–180)

## 2018-02-02 ENCOUNTER — Other Ambulatory Visit: Payer: Self-pay | Admitting: *Deleted

## 2018-02-02 DIAGNOSIS — Z1231 Encounter for screening mammogram for malignant neoplasm of breast: Secondary | ICD-10-CM

## 2018-02-02 NOTE — Progress Notes (Unsigned)
MMG appt scheduled for patient at [facility] in [place]. [phone]  Screening Bilateral 3D MMG scheduled for Tuesday June 25th at 8:15am.  Left voicemail for patient with appt information.  Need to fax an order per scheduler. Fax #[phone]

## 2018-02-04 ENCOUNTER — Encounter: Payer: Self-pay | Admitting: Obstetrics & Gynecology

## 2018-02-06 ENCOUNTER — Telehealth: Payer: Self-pay | Admitting: Obstetrics & Gynecology

## 2018-02-06 ENCOUNTER — Other Ambulatory Visit: Payer: Self-pay | Admitting: Obstetrics & Gynecology

## 2018-02-06 MED ORDER — MEDROXYPROGESTERONE ACETATE 10 MG PO TABS: 10.0000 mg | ORAL_TABLET | Freq: Every day | ORAL | 0 refills | Status: DC

## 2018-02-06 NOTE — Telephone Encounter (Signed)
Detailed message send via mychart.  Ok to close encounter.

## 2018-02-06 NOTE — Telephone Encounter (Signed)
Patient has questions about labwork that she saw on MyChart.

## 2018-02-06 NOTE — Telephone Encounter (Signed)
Dr. Hyacinth MeekerMiller -please review labs dated 01/30/18 and advise.

## 2018-02-09 ENCOUNTER — Telehealth: Payer: Self-pay | Admitting: Obstetrics & Gynecology

## 2018-02-09 ENCOUNTER — Encounter: Payer: Self-pay | Admitting: Obstetrics & Gynecology

## 2018-02-09 NOTE — Telephone Encounter (Signed)
Encounter closed

## 2018-02-09 NOTE — Telephone Encounter (Signed)
<<   Less Detail  Visit Follow-Up Question  Georgana CurioFaith R Bailey-Varnam  Sent: Mon February 09, 2018 3:00 PM  To: Sharol GivenP [facility]      Message   ----- Message from Mychart, Generic sent at 02/09/2018 3:00 PM EDT -----    Hey Dr. Hyacinth MeekerMiller-  Took the pill you prescribed starting June 3 and finished it on June 12. Started spotting Saturday 6/24 and started 6/25.     As for my labs- WBC has been elevated at least 8-10 yrs. Saw hematologist and was told that is my normal.  At one point last yr or so WBC ended up at 16 and went to another Hematologists ran tests and didnt find anything either.     A1c was 5.7 and been right around there for yrs. Bloodwork 08/26/17 it went up to 6.1 was told to eat low carb and cut out sweets. As for liver function test 12/17/17- AST was 45 and ALT was 64. Was sent for ultrasound for liver and was told I had a fatty liver. Cholesterol was checked 08/26/17 and total was 227 and triglycerides at that time were 331 use to be 500 at one time long time ago so I am making progress. Physical was 08/26/17 and no follow up visit was scheduled or mentioned. Got sick in May made appointment with Heloise OchoaErin Dills who is more thorough and she sent me for US. No luck finding good primary.

## 2018-02-10 NOTE — Telephone Encounter (Signed)
Routing to Dr. Miller FYI.  

## 2018-03-12 ENCOUNTER — Encounter: Payer: Self-pay | Admitting: Obstetrics & Gynecology

## 2018-03-12 DIAGNOSIS — K760 Fatty (change of) liver, not elsewhere classified: Secondary | ICD-10-CM

## 2018-03-12 HISTORY — DX: Fatty (change of) liver, not elsewhere classified: K76.0

## 2018-03-16 ENCOUNTER — Encounter: Payer: Self-pay | Admitting: Obstetrics & Gynecology

## 2018-09-02 ENCOUNTER — Other Ambulatory Visit: Payer: Self-pay | Admitting: Obstetrics & Gynecology

## 2018-09-02 ENCOUNTER — Telehealth: Payer: Self-pay | Admitting: Obstetrics & Gynecology

## 2018-09-02 ENCOUNTER — Encounter: Payer: Self-pay | Admitting: Obstetrics & Gynecology

## 2018-09-02 NOTE — Telephone Encounter (Signed)
Spoke with patient. Took provera 04/2018, menses followed. Menses early Oct 2019. 10/24 spotting, followed by normal cycle first wk of November. Spotting continued with lower right sided pain. Seen by PCP, CT abdomen pelvis without contrast done 07/2018 at [facility] radiology, [place] [place]. Negative, PCP recommended returning for CT abdomen/pelvis with contrast. Was advised cyst on left ovary. Urine culture negative. Patient reports Hx of uterine polyp, would like Dr. Hyacinth Meeker to be aware and if any additional recommendations?   Patient denies pain, bleeding, urinary symptoms, N/V, fever/chills.  Patient has copy of CT reports and CDs. Patient will upload copy of CT reports and attach to MyChart message to Dr. Hyacinth Meeker for review.   Advised I will forward to Dr. Hyacinth Meeker to update, will return call with additional recommendations once reviewed. Patient agreeable.   Routing to Dr. Hyacinth Meeker

## 2018-09-02 NOTE — Telephone Encounter (Signed)
Patient sent the following correspondence through MyChart. Routing to triage to assist patient with request.  Hi Dr. Hyacinth Meeker,  I wanted to let you know that on Jun 11, 2018 that it felt what I would say would be like my water broke (best way to explain it I guess) went to the bathroom had small amount of blood on panty liner and from there spotted a day or two and then had a period for a week. After that week I still saw blood until Nov 21 (only when wiping not like a period and nothing on panty liner and I haven't seen anything since. I was wondering if the uterine polyp might have burst? When I went to my primary doctor right after this and there was blood in my urine (we think not sure if it was that or from the spotting). I had a CT and the only thing it showed was a cyst on my left ovary. (She thought I may have a kidney stone but nothing showed up). I wanted to see what you think.Marland KitchenMarland Kitchen

## 2018-09-02 NOTE — Telephone Encounter (Signed)
I am forwarding this Provera request to the patient's primary gynecologist, Dr. Hyacinth Meeker.

## 2018-09-03 ENCOUNTER — Telehealth: Payer: Self-pay | Admitting: Obstetrics & Gynecology

## 2018-09-03 ENCOUNTER — Other Ambulatory Visit: Payer: Self-pay | Admitting: Obstetrics & Gynecology

## 2018-09-03 ENCOUNTER — Encounter: Payer: Self-pay | Admitting: Obstetrics & Gynecology

## 2018-09-03 MED ORDER — MEDROXYPROGESTERONE ACETATE 10 MG PO TABS: 10.0000 mg | ORAL_TABLET | Freq: Every day | ORAL | 0 refills | Status: AC

## 2018-09-03 NOTE — Telephone Encounter (Signed)
Spoke with patient, advised as seen below per Dr. Hyacinth Meeker. Advised Rx for Provera 10mg  daily for 10 days to [facility] on file, take if no spontaneous menses q3 months, will take next mid February if no menses. Patient verbalizes understanding and is agreeable.   Encounter closed.

## 2018-09-03 NOTE — Telephone Encounter (Signed)
Reviewed CT.  I would not recommend follow up ultrasound of the ovary for this CT finding and this looks benign.  For clitoral swelling, depending on what it looks like she might need a testosterone level to make sure this is normal.  If testosterone level is normal, then this could be an irritant or yeast causing the symptoms.  She could consider trying OTC hydrocortisone to three times daily for a week and if that doesn't help, then try OTC clotrimazole BID for a week.  If that doesn't help, she might want to be seen.

## 2018-09-03 NOTE — Telephone Encounter (Signed)
Patient sent the following correspondence through MyChart. Routing to triage to assist patient with request.  Here are the CT reports that I had done in Dec and Nov 2019, see MyChart message  And, today she sent another message:  I have forgot to mention that the area of the urethra/clitoris has been inflamed swollen. It will be uncomfortable when urinating and I have had urinalysis done and they come out normal. The only thing that helps is soaking in the bathtub.

## 2018-09-03 NOTE — Telephone Encounter (Signed)
Copy of CT scan reports printed and to Dr. Hyacinth Meeker for review.   Routing to Dr. Hyacinth Meeker.

## 2018-11-23 ENCOUNTER — Telehealth: Payer: Self-pay | Admitting: *Deleted

## 2018-11-23 ENCOUNTER — Encounter: Payer: Self-pay | Admitting: Obstetrics & Gynecology

## 2018-11-23 NOTE — Telephone Encounter (Signed)
the area of the urethra/clitoris has been inflamed swollen still. Having some pelvic pressure and vagina soreness.  I wanted to see if you were currently seeing patients in the office or not.

## 2018-11-23 NOTE — Telephone Encounter (Signed)
No answer, mailbox full, unable to leave message.

## 2018-12-10 NOTE — Telephone Encounter (Signed)
Call to patient, no answer, mailbox full, unable to leave message.  

## 2019-05-14 ENCOUNTER — Ambulatory Visit: Payer: BC Managed Care – PPO | Admitting: Obstetrics & Gynecology

## 2021-03-29 ENCOUNTER — Telehealth: Payer: Self-pay

## 2021-03-29 NOTE — Telephone Encounter (Signed)
Former patient of Dr. Rondel Baton. Has not seen her since 2019 and not scheduled here or with Dr. Hyacinth Meeker. She called to inquire about date of biopsy she had in 2019 and what type bx it was.  It was actually 11/18/2016 and endometrial biopsy and patient was provided this info.
